# Patient Record
Sex: Female | Born: 1960 | Hispanic: No | Marital: Married | Smoking: Former smoker
Health system: Southern US, Community
[De-identification: ages and names within clinical notes are randomized; demographics above are authoritative.]

## PROBLEM LIST (undated history)

## (undated) DIAGNOSIS — R5383 Other fatigue: Secondary | ICD-10-CM

## (undated) DIAGNOSIS — I6529 Occlusion and stenosis of unspecified carotid artery: Secondary | ICD-10-CM

## (undated) DIAGNOSIS — E042 Nontoxic multinodular goiter: Secondary | ICD-10-CM

## (undated) HISTORY — PX: ABDOMINAL HYSTERECTOMY: SHX81

## (undated) HISTORY — DX: Other fatigue: R53.83

## (undated) HISTORY — DX: Nontoxic multinodular goiter: E04.2

## (undated) HISTORY — DX: Occlusion and stenosis of unspecified carotid artery: I65.29

---

## 2018-05-01 ENCOUNTER — Other Ambulatory Visit: Payer: Self-pay

## 2018-05-01 ENCOUNTER — Encounter: Payer: Self-pay | Admitting: Vascular Surgery

## 2018-05-01 DIAGNOSIS — I6523 Occlusion and stenosis of bilateral carotid arteries: Secondary | ICD-10-CM

## 2018-05-11 ENCOUNTER — Encounter: Payer: Self-pay | Admitting: Vascular Surgery

## 2018-05-11 ENCOUNTER — Ambulatory Visit (INDEPENDENT_AMBULATORY_CARE_PROVIDER_SITE_OTHER): Payer: 59 | Admitting: Vascular Surgery

## 2018-05-11 ENCOUNTER — Ambulatory Visit (HOSPITAL_COMMUNITY)
Admission: RE | Admit: 2018-05-11 | Discharge: 2018-05-11 | Disposition: A | Discharge status: Home / Self Care | Payer: 59 | Source: Ambulatory Visit | Attending: Vascular Surgery | Admitting: Vascular Surgery

## 2018-05-11 VITALS — BP 164/98 | HR 100 | Temp 97.3°F | Resp 18 | Wt 163.0 lb

## 2018-05-11 DIAGNOSIS — I6523 Occlusion and stenosis of bilateral carotid arteries: Secondary | ICD-10-CM | POA: Insufficient documentation

## 2018-05-11 NOTE — Progress Notes (Signed)
Vascular and Vein Specialist of [facility] office  Patient name: Jessica Petersen MRN: [id number] DOB: Dec 07, 1960 Sex: female  REASON FOR CONSULT: Evaluation of carotid stenosis  HPI: Jessica Petersen is a 58 y.o. female, who is here today for evaluation of carotid stenosis.  She was having some soreness in her right neck.  Underwent ultrasound for further evaluation of this and was found to have thyroid nodule and also some thickening of the carotid.  She is undergone a formal carotid duplex at [facility] today and is here today for further discussion of this.  She has never had any hemispheric symptoms.  No transient ischemic attack or stroke.  No aphasia or amaurosis fugax.  He has had a fine-needle aspiration of the thyroid nodule and does not have the results of this back yet.  Past Medical History:  Diagnosis Date  . Carotid artery occlusion   . Fatigue   . Multiple thyroid nodules     Family History  Problem Relation Age of Onset  . Heart disease Father     SOCIAL HISTORY: Social History   Socioeconomic History  . Marital status: Married    Spouse name: Not on file  . Number of children: Not on file  . Years of education: Not on file  . Highest education level: Not on file  Occupational History  . Not on file  Social Needs  . Financial resource strain: Not on file  . Food insecurity:    Worry: Not on file    Inability: Not on file  . Transportation needs:    Medical: Not on file    Non-medical: Not on file  Tobacco Use  . Smoking status: Former Games developer  . Smokeless tobacco: Never Used  Substance and Sexual Activity  . Alcohol use: Not Currently  . Drug use: Never  . Sexual activity: Not on file  Lifestyle  . Physical activity:    Days per week: Not on file    Minutes per session: Not on file  . Stress: Not on file  Relationships  . Social connections:    Talks on phone: Not on file    Gets  together: Not on file    Attends religious service: Not on file    Active member of club or organization: Not on file    Attends meetings of clubs or organizations: Not on file    Relationship status: Not on file  . Intimate partner violence:    Fear of current or ex partner: Not on file    Emotionally abused: Not on file    Physically abused: Not on file    Forced sexual activity: Not on file  Other Topics Concern  . Not on file  Social History Narrative  . Not on file    Allergies  Allergen Reactions  . Naproxen Itching  . Lortab [Hydrocodone-Acetaminophen] Palpitations    No current outpatient medications on file.   No current facility-administered medications for this visit.     REVIEW OF SYSTEMS:  [X]  denotes positive finding, [ ]  denotes negative finding Cardiac  Comments:  Chest pain or chest pressure:    Shortness of breath upon exertion:    Short of breath when lying flat:    Irregular heart rhythm:        Vascular    Pain in calf, thigh, or hip brought on by ambulation:    Pain in feet at night that wakes you up from your sleep:  Blood clot in your veins:    Leg swelling:         Pulmonary    Oxygen at home:    Productive cough:     Wheezing:         Neurologic    Sudden weakness in arms or legs:     Sudden numbness in arms or legs:     Sudden onset of difficulty speaking or slurred speech:    Temporary loss of vision in one eye:     Problems with dizziness:         Gastrointestinal    Blood in stool:     Vomited blood:         Genitourinary    Burning when urinating:     Blood in urine:        Psychiatric    Major depression:         Hematologic    Bleeding problems:    Problems with blood clotting too easily:        Skin    Rashes or ulcers:        Constitutional    Fever or chills:      PHYSICAL EXAM: Vitals:   05/11/18 0951 05/11/18 0957  BP: (!) 158/106 (!) 164/98  Pulse: 90 100  Resp: 18   Temp: (!) 97.3 F (36.3 C)    TempSrc: Temporal   Weight: 163 lb (73.9 kg)     GENERAL: The patient is a well-nourished female, in no acute distress. The vital signs are documented above. CARDIOVASCULAR: Carotid arteries without bruits bilaterally.  2+ radial and 2+ dorsalis pedis pulses PULMONARY: There is good air exchange  ABDOMEN: Soft and non-tender  MUSCULOSKELETAL: There are no major deformities or cyanosis. NEUROLOGIC: No focal weakness or paresthesias are detected. SKIN: There are no ulcers or rashes noted. PSYCHIATRIC: The patient has a normal affect.  DATA:  Carotid duplex from [facility] was reviewed with the patient this showed mild intimal thickening bilaterally but no significant stenosis.  Vertebral flow antegrade bilaterally   3 cm right  thyroid nodule was noted  MEDICAL ISSUES: Discussed these findings with the patient.  She does not have any evidence of significant carotid disease.  I do not feel that she has any increased risk for stroke related to this.  I would not recommend any follow-up duplex unless she had new symptoms.  She was relieved with this discussion and will see [place] again on an as-needed basis   Larina Earthly, MD [facility] Vascular and Vein Specialists of [facility] Tel [phone] Pager [phone]

## 2020-09-14 IMAGING — US BILATERAL CAROTID DUPLEX ULTRASOUND
1 series · 13 of 24 positions shown · non-contrast
Comparison: None.

CLINICAL DATA: Stenosis on office exam

EXAM:
BILATERAL CAROTID DUPLEX ULTRASOUND
TECHNIQUE: Gray scale imaging, color Doppler and duplex ultrasound were
performed of bilateral carotid and vertebral arteries in the neck.

[Series 1: bilateral carotid duplex ultrasound · 13 of 69 slices shown]
[im 1/69]
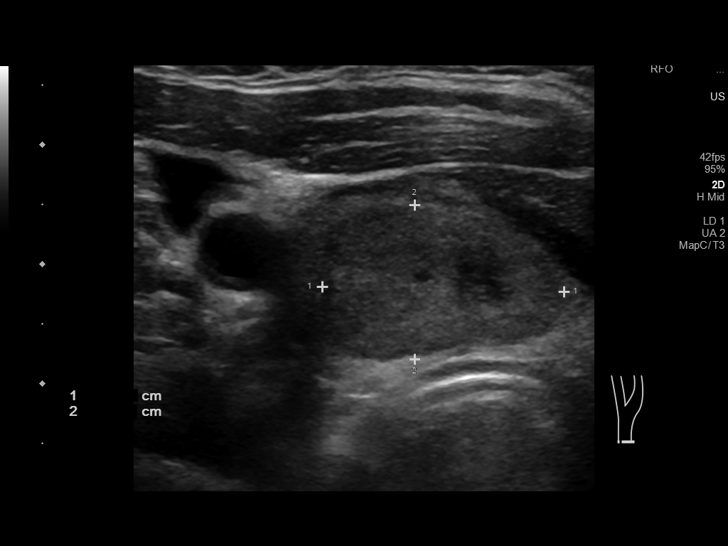
[im 6/69]
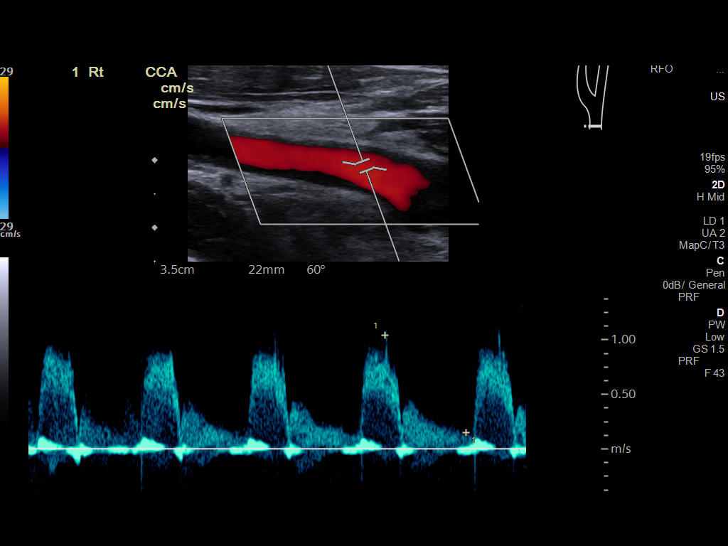
[im 12/69]
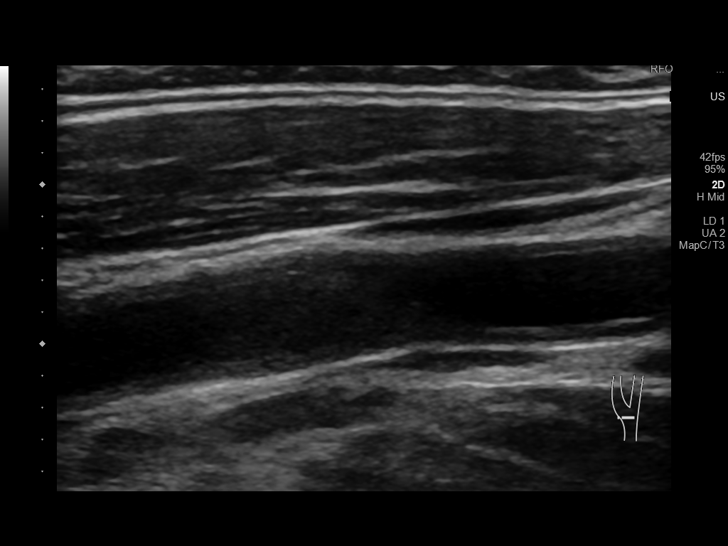
[im 18/69]
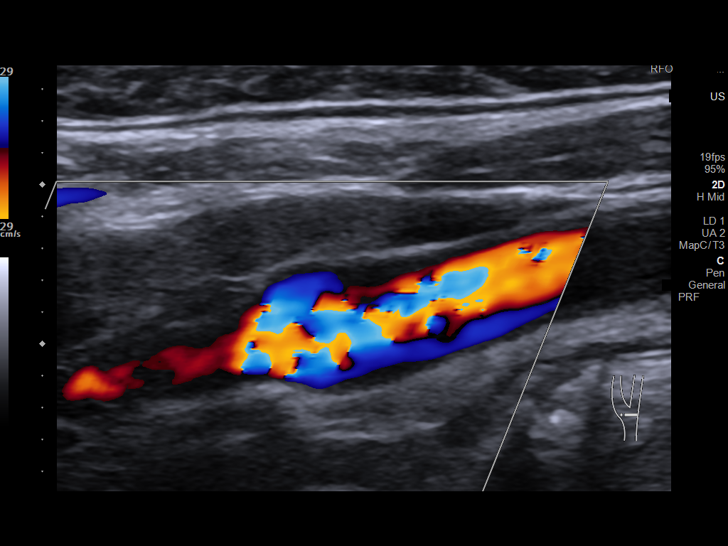
[im 24/69]
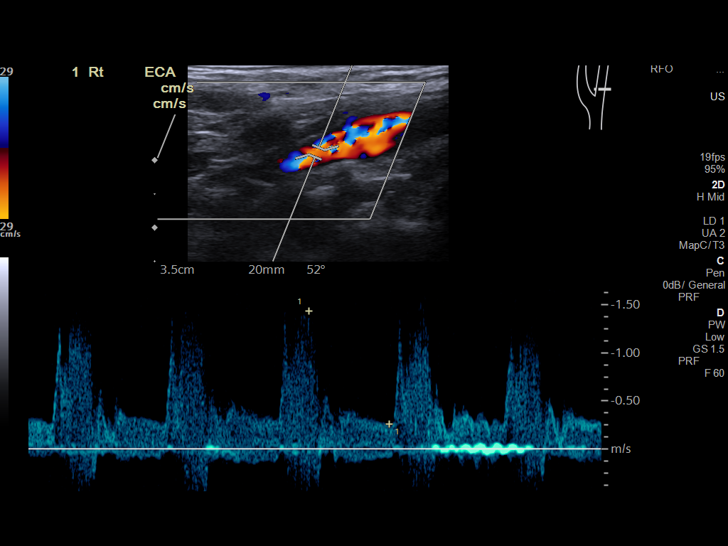
[im 30/69]
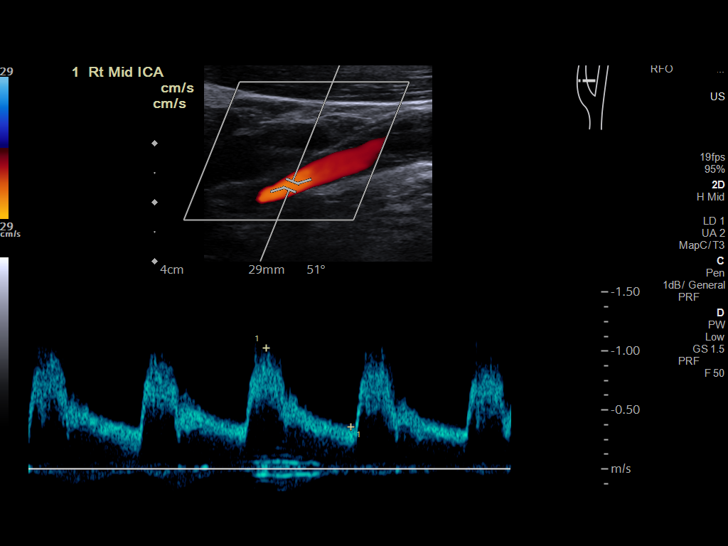
[im 36/69]
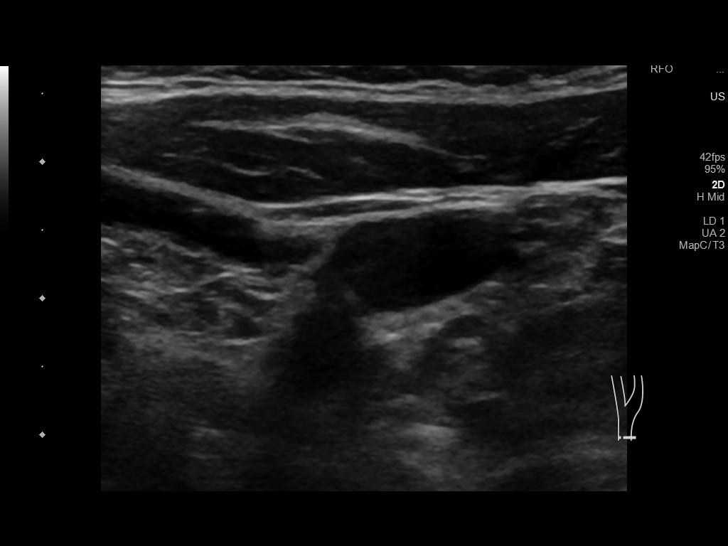
[im 39/69]
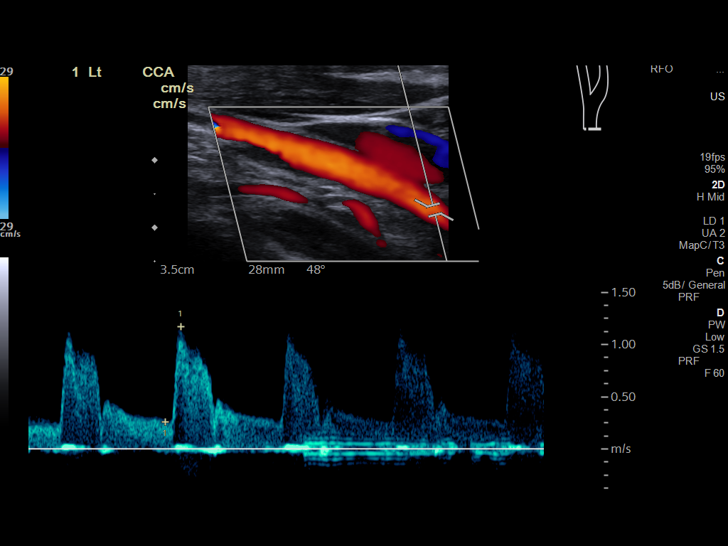
[im 45/69]
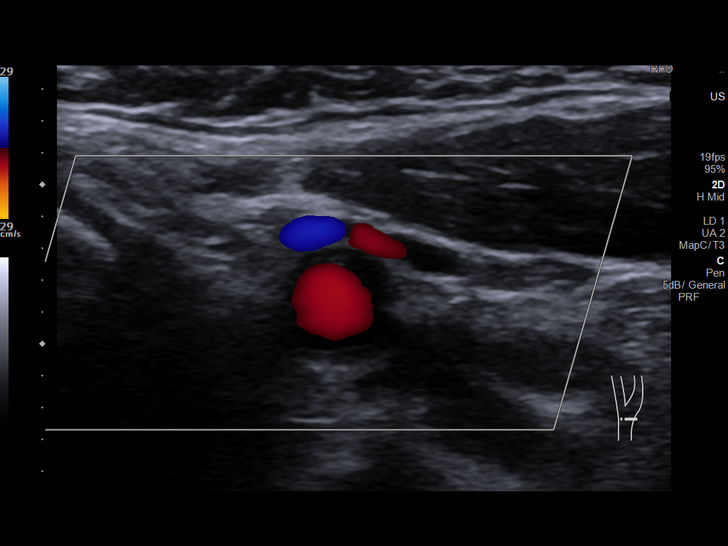
[im 51/69]
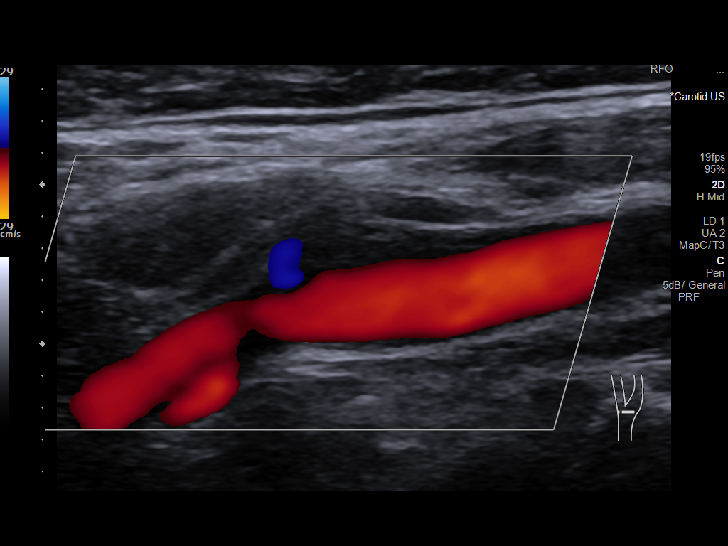
[im 57/69]
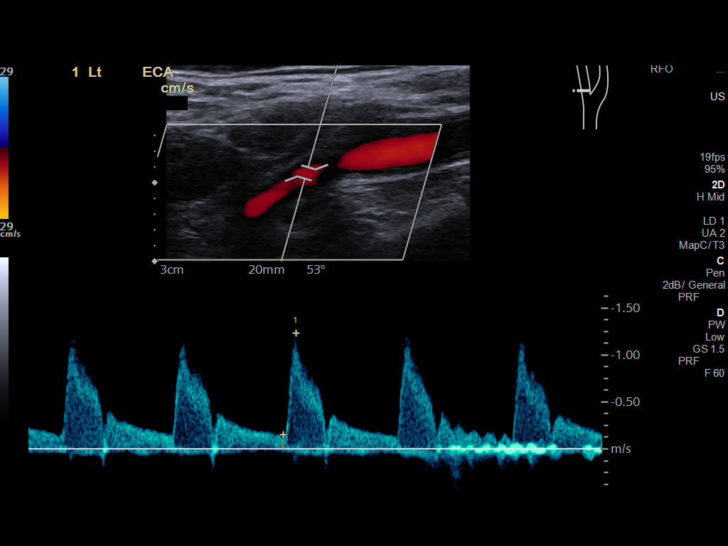
[im 63/69]
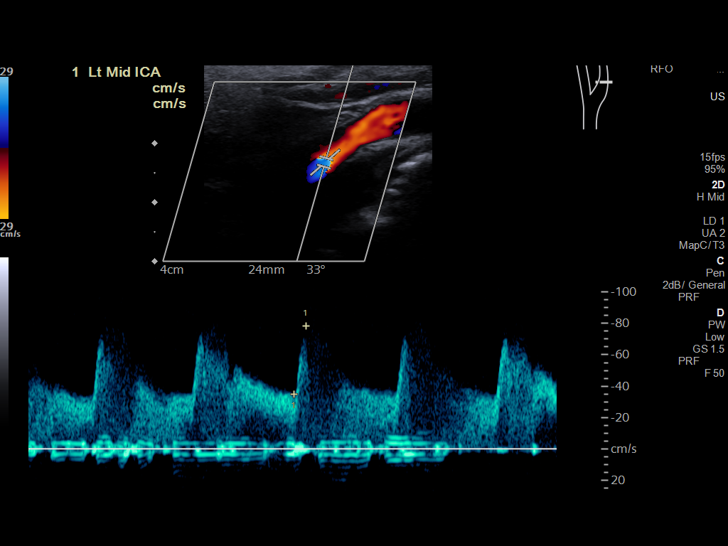
[im 69/69]
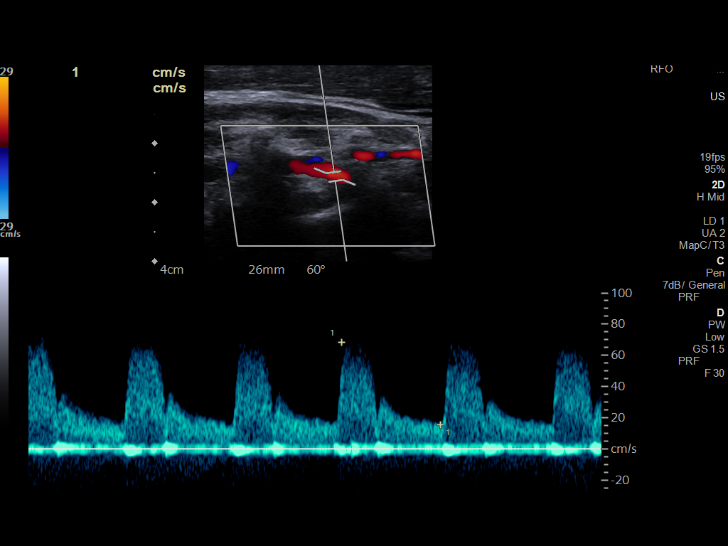

[13 of 24 positions shown; findings below may reference images not displayed]

FINDINGS: Criteria: Quantification of carotid stenosis is based on velocity
parameters that correlate the residual internal carotid diameter
with NASCET-based stenosis levels, using the diameter of the distal
internal carotid lumen as the denominator for stenosis measurement.

The following velocity measurements were obtained:

RIGHT

ICA: 104/41 cm/sec

CCA: 144/40 cm/sec

SYSTOLIC ICA/CCA RATIO:

ECA: 144 cm/sec

LEFT

ICA: 78/35 cm/sec

CCA: 85/23 cm/sec

SYSTOLIC ICA/CCA RATIO:

ECA: 123 cm/sec

RIGHT CAROTID ARTERY: 2.3 cm hypoechoic right thyroid nodule
incidentally noted. Intimal thickening through the common carotid
artery and bulb. No definite high-grade stenosis. Normal waveforms
and color Doppler signal.

RIGHT VERTEBRAL ARTERY:  Normal flow direction and waveform.

LEFT CAROTID ARTERY: Intimal thickening in the common carotid artery
and bulb. No high-grade stenosis. Normal waveforms and color Doppler
signal. Plaque at the external carotid artery origin.

LEFT VERTEBRAL ARTERY:  Normal flow direction and waveform.
IMPRESSION: 1. Bilateral carotid plaque resulting in less than 50% diameter
stenosis.
2.  Antegrade bilateral vertebral arterial flow.
3. 2.3 cm right thyroid nodule. Consider further evaluation with
thyroid ultrasound. If patient is clinically hyperthyroid, consider
nuclear medicine thyroid uptake and scan.

## 2022-12-23 ENCOUNTER — Encounter (INDEPENDENT_AMBULATORY_CARE_PROVIDER_SITE_OTHER): Payer: Self-pay | Admitting: *Deleted

## 2022-12-30 ENCOUNTER — Encounter (INDEPENDENT_AMBULATORY_CARE_PROVIDER_SITE_OTHER): Payer: Self-pay | Admitting: Gastroenterology

## 2022-12-30 ENCOUNTER — Ambulatory Visit (INDEPENDENT_AMBULATORY_CARE_PROVIDER_SITE_OTHER): Payer: 59 | Admitting: Gastroenterology

## 2022-12-30 VITALS — BP 177/81 | HR 78 | Temp 97.5°F | Ht 63.0 in | Wt 162.0 lb

## 2022-12-30 DIAGNOSIS — R7989 Other specified abnormal findings of blood chemistry: Secondary | ICD-10-CM | POA: Diagnosis not present

## 2022-12-30 NOTE — Patient Instructions (Signed)
Perform blood workup If negative testing, will consider hematology referral

## 2022-12-30 NOTE — Progress Notes (Signed)
Katrinka Blazing, M.D. Gastroenterology & Hepatology [facility] [address] [place], [place] [address] Primary Care Physician: Wendall Papa [address] [place] [address]  Referring MD: PCP  Chief Complaint: Elevated ferritin  History of Present Illness: Jessica Petersen is a 62 y.o. female who presents for evaluation of elevated ferritin.  Received labs from referral.  Labs from 12/12/2022 showed a TSH of 0.80, CMP with BUN 6, creatinine 0.8, sodium 141, potassium 4.5, AST 26, ALT 22, alkaline phosphatase 75, total bilirubin 0.3, albumin 4.2, iron 77, ferritin 326. Patient had previous labs in the past that showed elevated ferritin: labs at [facility] in 10/2016 that showed ferritin 272., iron saturation 33%, iron 106. Repeat blood workup in 2019 showed ferritin 306, 290 in 2021.  Patient reports that she was told when younger she had low iron - took ferrous sulfate when she has a teenager. She states she may have taken some iron during the summer months but not consistently.  Patient denies having any complaints and has been asymptomatic.  The patient denies having any nausea, vomiting, fever, chills, hematochezia, melena, hematemesis, abdominal distention, abdominal pain, diarrhea, jaundice, pruritus or weight loss.  Last EGD: never Last Colonoscopy: 2015 - performed at [facility], patient told to repeat in 10 years, no polyps per patient but no reports available  FHx: neg for any gastrointestinal/liver disease, mother pancreatic cancer s/p Whipple surgery Social: quit smoking 30 years ago, neg alcohol or illicit drug use Surgical: hysterectomy, 2 c-sections,   Past Medical History: Past Medical History:  Diagnosis Date   Carotid artery occlusion    Fatigue    Multiple thyroid nodules     Past Surgical History: Past Surgical History:  Procedure Laterality Date   ABDOMINAL HYSTERECTOMY     CESAREAN SECTION       Family History: Family History  Problem Relation Age of Onset   Heart disease Father     Social History: Social History   Tobacco Use  Smoking Status Former  Smokeless Tobacco Never   Social History   Substance and Sexual Activity  Alcohol Use Not Currently   Social History   Substance and Sexual Activity  Drug Use Never    Allergies: Allergies  Allergen Reactions   Naproxen Itching   Lortab [Hydrocodone-Acetaminophen] Palpitations    Medications: No current outpatient medications on file.   No current facility-administered medications for this visit.    Review of Systems: GENERAL: negative for malaise, night sweats HEENT: No changes in hearing or vision, no nose bleeds or other nasal problems. NECK: Negative for lumps, goiter, pain and significant neck swelling RESPIRATORY: Negative for cough, wheezing CARDIOVASCULAR: Negative for chest pain, leg swelling, palpitations, orthopnea GI: SEE HPI MUSCULOSKELETAL: Negative for joint pain or swelling, back pain, and muscle pain. SKIN: Negative for lesions, rash PSYCH: Negative for sleep disturbance, mood disorder and recent psychosocial stressors. HEMATOLOGY Negative for prolonged bleeding, bruising easily, and swollen nodes. ENDOCRINE: Negative for cold or heat intolerance, polyuria, polydipsia and goiter. NEURO: negative for tremor, gait imbalance, syncope and seizures. The remainder of the review of systems is noncontributory.   Physical Exam: BP (!) 177/81 (BP Location: Left Arm, Patient Position: Sitting, Cuff Size: Normal)   Pulse 78   Temp (!) 97.5 F (36.4 C) (Temporal)   Ht 5\' 3"  (1.6 m)   Wt 162 lb (73.5 kg)   BMI 28.70 kg/m  GENERAL: The patient is AO x3, in no acute distress. HEENT: Head is normocephalic  and atraumatic. EOMI are intact. Mouth is well hydrated and without lesions. NECK: Supple. No masses LUNGS: Clear to auscultation. No presence of rhonchi/wheezing/rales. Adequate chest  expansion HEART: RRR, normal s1 and s2. ABDOMEN: Soft, nontender, no guarding, no peritoneal signs, and nondistended. BS +. No masses. EXTREMITIES: Without any cyanosis, clubbing, rash, lesions or edema. NEUROLOGIC: AOx3, no focal motor deficit. SKIN: no jaundice, no rashes   Imaging/Labs: as above  I personally reviewed and interpreted the available labs, imaging and endoscopic files.  Impression and Plan: Jessica Petersen is a 62 y.o. female who presents for evaluation of elevated ferritin.  Patient has presented a chronic history of mildly elevated ferritin.  She has been completely asymptomatic and denies having any complaints.  No family history of hemochromatosis.  In fact, she believes that her mother had a diagnosis of thalassemia.  She has not been taking any exogenous iron supplementation on a regular basis recently.  We discussed that even though her iron studies are showing increased ferritin, the rest of her iron panel has been normal.  Differential for her ferritin elevation is Brode.  Notably, her previous LFTs have been completely normal which makes it less likely that she has hemochromatosis.  We will perform gene testing for now.  I discussed with her that after results of gene testing are back, we will refer her for further evaluation by hematology.  -Check hemochromatosis gene testing -Once blood workup is back will consider hematology referral  All questions were answered.      Katrinka Blazing, MD Gastroenterology and Hepatology [facility]

## 2022-12-31 ENCOUNTER — Telehealth (INDEPENDENT_AMBULATORY_CARE_PROVIDER_SITE_OTHER): Payer: Self-pay | Admitting: *Deleted

## 2022-12-31 NOTE — Telephone Encounter (Signed)
Prog note faxed to pcp

## 2022-12-31 NOTE — Telephone Encounter (Signed)
Mimi from [facility] called requesting office visit note from Dr. Levon Hedger 12/30/22 to be faxed over (I'm sorry she didn't give fax number and I forgot to ask )  Atten mimi

## 2023-01-10 LAB — HEMOCHROMATOSIS DNA-PCR(C282Y,H63D)

## 2023-03-12 NOTE — Progress Notes (Signed)
 [facility] [address] [place] [address]   Clinic Day:  03/13/2023  Referring physician: Hairfield, Keavie C, NP  Patient Care Team: Hairfield, Keavie C as PCP - General (Nurse Practitioner)   ASSESSMENT & PLAN:   Assessment:  1.  Elevated ferritin levels: - Labs by Keavie Hairfield on 12/18/2022: Ferritin 326, serum iron 77. - She was evaluated by Dr. Castaneda for elevated ferritin. - Hemochromatosis panel: Negative. - Took iron tablet intermittently, last took in summer 2024.  No prior history of blood transfusion or intravenous iron therapy.  Does not use Caston pots or pans on regular basis.  Mother has thalassemia.  2. Social/Family History: -She is retired. Quit tobacco use in 1992 of 0.5 ppd for 20 years. -No family history of hemachromatosis. Mother may have thalassemia and has not required any blood transfusions. Mother has pancreatic cancer. Maternal uncle had prostate cancer and pancreatic cancer. Another paternal uncle had prostate cancer.   Plan:  1.  Elevated ferritin levels with negative hemochromatosis panel: - Elevated ferritin previously could be transient from inflammatory state. - She could also have thalassemia trait which could contribute to ineffective erythropoiesis and elevated ferritin levels. - Will repeat ferritin level today along with CBC.  Will also check hemoglobin electrophoresis.  Will check LFTs to evaluate for underlying liver disease. - RTC 2 weeks for follow-up.  If continues to have elevated ferritin levels, will do [facility] hemochromatosis panel which does additional mutation testing.   Orders Placed This Encounter  Procedures   CBC    Standing Status:   Future    Number of Occurrences:   1    Expected Date:   03/13/2023    Expiration Date:   03/12/2024   Reticulocytes    Standing Status:   Future    Number of Occurrences:   1    Expected Date:   03/13/2023    Expiration Date:   03/12/2024   Lactate dehydrogenase     Standing Status:   Future    Number of Occurrences:   1    Expected Date:   03/13/2023    Expiration Date:   03/12/2024   Iron and TIBC ([facility] ONLY)    Standing Status:   Future    Number of Occurrences:   1    Expected Date:   03/13/2023    Expiration Date:   03/12/2024   Ferritin    Standing Status:   Future    Number of Occurrences:   1    Expected Date:   03/13/2023    Expiration Date:   03/12/2024   Hgb Fractionation Cascade    Standing Status:   Future    Number of Occurrences:   1    Expected Date:   03/13/2023    Expiration Date:   03/12/2024   Hepatic function panel    Standing Status:   Future    Number of Occurrences:   1    Expected Date:   03/13/2023    Expiration Date:   03/12/2024      LILLETTE Hummingbird R Teague,acting as a scribe for Alean Stands, MD.,have documented all relevant documentation on the behalf of Alean Stands, MD,as directed by  Alean Stands, MD while in the presence of Alean Stands, MD.   I, Alean Stands MD, have reviewed the above documentation for accuracy and completeness, and I agree with the above.   Alean Stands, MD   1/9/20256:08 PM  CHIEF COMPLAINT/PURPOSE OF  CONSULT:   Diagnosis: Elevated ferritin  Current Therapy: None  HISTORY OF PRESENT ILLNESS:   Jessica Petersen is a 63 y.o. female presenting to clinic today for evaluation of elevated ferritin at the request of Dr. Eartha.  Patient has a family history of hemachromatosis and believes her mother may have been diagnosed with thalassemia. Hemachromatosis DNA-PCR lab from 12/30/22 was negative.   Labs from 12/12/2022 showed a TSH of 0.80, CMP with BUN 6, creatinine 0.8, sodium 141, potassium 4.5, AST 26, ALT 22, alkaline phosphatase 75, total bilirubin 0.3, albumin 4.2, iron 77, ferritin 326.   Today, she states that she is doing well overall. Her appetite level is at 100%. Her energy level is at 50%.  The last time she took oral iron was  in the summer of 2024 for 1 week. She reports she took oral iron due to a history of low iron and anemia. In 2022 or 2023, she had been told to take oral iron by Dr. Leavy. The longest she has taken oral iron consecutively is 1 month. She has no history of receiving IV iron or blood transfusions. She does not cook with any cast iron pots or pans, though she has cornbread 2-3 x a month that is baked in cast iron. She is not on any prescribed medications and she occasionally takes multivitamins, B-12 supplements, and fish oil with the lat time she took these being in the summer of 2024. She denies any BRBPR, melena, fevers, or night sweats.  PAST MEDICAL HISTORY:   Past Medical History: Past Medical History:  Diagnosis Date   Carotid artery occlusion    Fatigue    Multiple thyroid nodules     Surgical History: Past Surgical History:  Procedure Laterality Date   ABDOMINAL HYSTERECTOMY     CESAREAN SECTION      Social History: Social History   Socioeconomic History   Marital status: Married    Spouse name: Not on file   Number of children: Not on file   Years of education: Not on file   Highest education level: Not on file  Occupational History   Not on file  Tobacco Use   Smoking status: Former   Smokeless tobacco: Never  Vaping Use   Vaping status: Never Used  Substance and Sexual Activity   Alcohol use: Not Currently   Drug use: Never   Sexual activity: Not on file  Other Topics Concern   Not on file  Social History Narrative   Not on file   Social Drivers of Health   Financial Resource Strain: Not on file  Food Insecurity: No Food Insecurity (03/13/2023)   Hunger Vital Sign    Worried About Running Out of Food in the Last Year: Never true    Ran Out of Food in the Last Year: Never true  Transportation Needs: No Transportation Needs (03/13/2023)   PRAPARE - Administrator, Civil Service (Medical): No    Lack of Transportation (Non-Medical): No  Physical  Activity: Not on file  Stress: Not on file  Social Connections: Not on file  Intimate Partner Violence: Not At Risk (03/13/2023)   Humiliation, Afraid, Rape, and Kick questionnaire    Fear of Current or Ex-Partner: No    Emotionally Abused: No    Physically Abused: No    Sexually Abused: No    Family History: Family History  Problem Relation Age of Onset   Heart disease Father     Current Medications: No current outpatient  medications on file.   Allergies: Allergies  Allergen Reactions   Naproxen Itching   Lortab [Hydrocodone-Acetaminophen] Palpitations    REVIEW OF SYSTEMS:   Review of Systems  Constitutional:  Negative for chills, fatigue and fever.  HENT:   Negative for lump/mass, mouth sores, nosebleeds, sore throat and trouble swallowing.   Eyes:  Negative for eye problems.  Respiratory:  Negative for cough and shortness of breath.   Cardiovascular:  Negative for chest pain, leg swelling and palpitations.  Gastrointestinal:  Negative for abdominal pain, constipation, diarrhea, nausea and vomiting.  Genitourinary:  Negative for bladder incontinence, difficulty urinating, dysuria, frequency, hematuria and nocturia.   Musculoskeletal:  Negative for arthralgias, back pain, flank pain, myalgias and neck pain.  Skin:  Negative for itching and rash.  Neurological:  Negative for dizziness, headaches and numbness.  Hematological:  Does not bruise/bleed easily.  Psychiatric/Behavioral:  Negative for depression, sleep disturbance and suicidal ideas. The patient is not nervous/anxious.   All other systems reviewed and are negative.    VITALS:   Blood pressure (!) 171/82, pulse 74, temperature 99.2 F (37.3 C), temperature source Oral, resp. rate 18, height 5' 3 (1.6 m), weight 157 lb (71.2 kg), SpO2 99%.  Wt Readings from Last 3 Encounters:  03/13/23 157 lb (71.2 kg)  12/30/22 162 lb (73.5 kg)  05/11/18 163 lb (73.9 kg)    Body mass index is 27.81 kg/m.   PHYSICAL  EXAM:   Physical Exam Vitals and nursing note reviewed. Exam conducted with a chaperone present.  Constitutional:      Appearance: Normal appearance.  Cardiovascular:     Rate and Rhythm: Normal rate and regular rhythm.     Pulses: Normal pulses.     Heart sounds: Normal heart sounds.  Pulmonary:     Effort: Pulmonary effort is normal.     Breath sounds: Normal breath sounds.  Abdominal:     Palpations: Abdomen is soft. There is no hepatomegaly, splenomegaly or mass.     Tenderness: There is no abdominal tenderness.  Musculoskeletal:     Right lower leg: No edema.     Left lower leg: No edema.  Lymphadenopathy:     Cervical: No cervical adenopathy.     Right cervical: No superficial, deep or posterior cervical adenopathy.    Left cervical: No superficial, deep or posterior cervical adenopathy.     Upper Body:     Right upper body: No supraclavicular or axillary adenopathy.     Left upper body: No supraclavicular or axillary adenopathy.  Neurological:     General: No focal deficit present.     Mental Status: She is alert and oriented to person, place, and time.  Psychiatric:        Mood and Affect: Mood normal.        Behavior: Behavior normal.     LABS:   CBC    Component Value Date/Time   WBC 6.3 03/13/2023 1051   RBC 5.87 (H) 03/13/2023 1051   RBC 5.70 (H) 03/13/2023 1051   HGB 14.0 03/13/2023 1051   HCT 43.0 03/13/2023 1051   PLT 296 03/13/2023 1051   MCV 75.4 (L) 03/13/2023 1051   MCH 24.6 (L) 03/13/2023 1051   MCHC 32.6 03/13/2023 1051   RDW 14.5 03/13/2023 1051    CMP    Component Value Date/Time   PROT 7.8 03/13/2023 1051   ALBUMIN 4.1 03/13/2023 1051   AST 22 03/13/2023 1051   ALT 22 03/13/2023 1051  ALKPHOS 65 03/13/2023 1051   BILITOT 0.6 03/13/2023 1051    No results found for: CEA1, CEA / No results found for: CEA1, CEA No results found for: PSA1 No results found for: CAN199 No results found for: CAN125  No results found  for: STEPHANY RINGS, A1GS, A2GS, BETS, BETA2SER, GAMS, MSPIKE, SPEI Lab Results  Component Value Date   TIBC 395 03/13/2023   FERRITIN 175 03/13/2023   IRONPCTSAT 25 03/13/2023   Lab Results  Component Value Date   LDH 152 03/13/2023     STUDIES:   No results found.

## 2023-03-13 ENCOUNTER — Inpatient Hospital Stay: Payer: 59

## 2023-03-13 ENCOUNTER — Inpatient Hospital Stay: Payer: 59 | Attending: Hematology | Admitting: Hematology

## 2023-03-13 ENCOUNTER — Encounter: Payer: Self-pay | Admitting: Hematology

## 2023-03-13 VITALS — BP 171/82 | HR 74 | Temp 99.2°F | Resp 18 | Ht 63.0 in | Wt 157.0 lb

## 2023-03-13 DIAGNOSIS — D563 Thalassemia minor: Secondary | ICD-10-CM | POA: Diagnosis not present

## 2023-03-13 DIAGNOSIS — Z87891 Personal history of nicotine dependence: Secondary | ICD-10-CM | POA: Insufficient documentation

## 2023-03-13 DIAGNOSIS — R7989 Other specified abnormal findings of blood chemistry: Secondary | ICD-10-CM | POA: Diagnosis not present

## 2023-03-13 LAB — CBC
HCT: 43 % (ref 36.0–46.0)
Hemoglobin: 14 g/dL (ref 12.0–15.0)
MCH: 24.6 pg — ABNORMAL LOW (ref 26.0–34.0)
MCHC: 32.6 g/dL (ref 30.0–36.0)
MCV: 75.4 fL — ABNORMAL LOW (ref 80.0–100.0)
Platelets: 296 10*3/uL (ref 150–400)
RBC: 5.7 MIL/uL — ABNORMAL HIGH (ref 3.87–5.11)
RDW: 14.5 % (ref 11.5–15.5)
WBC: 6.3 10*3/uL (ref 4.0–10.5)
nRBC: 0 % (ref 0.0–0.2)

## 2023-03-13 LAB — IRON AND TIBC
Iron: 100 ug/dL (ref 28–170)
Saturation Ratios: 25 % (ref 10.4–31.8)
TIBC: 395 ug/dL (ref 250–450)
UIBC: 295 ug/dL

## 2023-03-13 LAB — RETICULOCYTES
Immature Retic Fract: 8 % (ref 2.3–15.9)
RBC.: 5.87 MIL/uL — ABNORMAL HIGH (ref 3.87–5.11)
Retic Count, Absolute: 59.3 10*3/uL (ref 19.0–186.0)
Retic Ct Pct: 1 % (ref 0.4–3.1)

## 2023-03-13 LAB — FERRITIN: Ferritin: 175 ng/mL (ref 11–307)

## 2023-03-13 LAB — HEPATIC FUNCTION PANEL
ALT: 22 U/L (ref 0–44)
AST: 22 U/L (ref 15–41)
Albumin: 4.1 g/dL (ref 3.5–5.0)
Alkaline Phosphatase: 65 U/L (ref 38–126)
Bilirubin, Direct: 0.1 mg/dL (ref 0.0–0.2)
Indirect Bilirubin: 0.5 mg/dL (ref 0.3–0.9)
Total Bilirubin: 0.6 mg/dL (ref 0.0–1.2)
Total Protein: 7.8 g/dL (ref 6.5–8.1)

## 2023-03-13 LAB — LACTATE DEHYDROGENASE: LDH: 152 U/L (ref 98–192)

## 2023-03-13 NOTE — Patient Instructions (Addendum)
 You were seen and examined today by Dr. Rogers. Dr. Rogers is a hematologist, meaning that he specializes in blood abnormalities. Dr. Rogers discussed your past medical history, family history of cancers/blood conditions and the events that led to you being here today.  You were referred to Dr. Rogers due to having an elevated ferritin level..  Dr. Katragadda has recommended additional labs today for further evaluation.  Follow-up as scheduled.

## 2023-03-14 ENCOUNTER — Inpatient Hospital Stay: Payer: 59

## 2023-03-14 ENCOUNTER — Inpatient Hospital Stay: Payer: 59 | Admitting: Oncology

## 2023-03-18 LAB — HGB FRACTIONATION CASCADE
Hgb A2: 6.1 % — ABNORMAL HIGH (ref 1.8–3.2)
Hgb A: 93.3 % — ABNORMAL LOW (ref 96.4–98.8)
Hgb F: 0.6 % (ref 0.0–2.0)
Hgb S: 0 %

## 2023-03-27 ENCOUNTER — Inpatient Hospital Stay (HOSPITAL_BASED_OUTPATIENT_CLINIC_OR_DEPARTMENT_OTHER): Payer: 59 | Admitting: Hematology

## 2023-03-27 ENCOUNTER — Encounter: Payer: Self-pay | Admitting: Hematology

## 2023-03-27 VITALS — BP 179/93 | HR 82 | Temp 97.8°F | Resp 18 | Wt 156.0 lb

## 2023-03-27 DIAGNOSIS — R7989 Other specified abnormal findings of blood chemistry: Secondary | ICD-10-CM | POA: Diagnosis not present

## 2023-03-27 DIAGNOSIS — D563 Thalassemia minor: Secondary | ICD-10-CM | POA: Diagnosis not present

## 2023-03-27 NOTE — Patient Instructions (Signed)
[facility] at [facility] Discharge Instructions   You were seen and examined today by Dr. Ellin Saba.  He reviewed the results of your lab work we did at your previous visit. Your ferritin level was normal. 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   You may follow up with your regular doctor.    Thank you for choosing [facility] at [facility] to provide your oncology and hematology care.  To afford each patient quality time with our provider, please arrive at least 15 minutes before your scheduled appointment time.   If you have a lab appointment with the Cancer Center please come in thru the Main Entrance and check in at the main information desk.  You need to re-schedule your appointment should you arrive 10 or more minutes late.  We strive to give you quality time with our providers, and arriving late affects you and other patients whose appointments are after yours.  Also, if you no show three or more times for appointments you may be dismissed from the clinic at the providers discretion.     Again, thank you for choosing [facility].  Our hope is that these requests will decrease the amount of time that you wait before being seen by our physicians.       _____________________________________________________________  Should you have questions after your visit to [facility], please contact our office at [phone] and follow the prompts.  Our office hours are 8:00 a.m. and 4:30 p.m. Monday - Friday.  Please note that voicemails left after 4:00 p.m. may not be returned until the following business day.  We are closed weekends and major holidays.  You do have access to a nurse 24-7, just call the main number to the clinic [phone] and do not press any options, hold on the line and a nurse will answer  the phone.    For prescription refill requests, have your pharmacy contact our office and allow 72 hours.    Due to Covid, you will need to wear a mask upon entering the hospital. If you do not have a mask, a mask will be given to you at the Main Entrance upon arrival. For doctor visits, patients may have 1 support person age 45 or older with them. For treatment visits, patients can not have anyone with them due to social distancing guidelines and our immunocompromised population.

## 2023-03-27 NOTE — Progress Notes (Signed)
[facility] [place] [address], [place] [address]   Clinic Day:  04/03/2023  Referring physician: Wendall Papa, NP  Patient Care Team: Wendall Papa as PCP - General (Nurse Practitioner)   ASSESSMENT & PLAN:   Assessment:  1.  Elevated ferritin levels: - Labs by Jessica Petersen on 12/18/2022: Ferritin 326, serum iron 77. - She was evaluated by Dr. Levon Hedger for elevated ferritin. - Hemochromatosis panel: Negative. - Took iron tablet intermittently, last took in summer 2024.  No prior history of blood transfusion or intravenous iron therapy.  Does not use Caston pots or pans on regular basis.  Mother has thalassemia.  2. Social/Family History: -She is retired. Quit tobacco use in 1992 of 0.5 ppd for 20 years. -No family history of hemachromatosis. Mother may have thalassemia and has not required any blood transfusions. Mother has pancreatic cancer. Maternal uncle had prostate cancer and pancreatic cancer. Another paternal uncle had prostate cancer.   Plan:  1.  Elevated ferritin levels with negative hemochromatosis panel: - Elevated ferritin has resolved.  Current ferritin levels are 175 with percent saturation of 25.  This could be postinflammatory.  No indication for further workup.  She may follow-up with her PMD.  I will be glad to see her on an as-needed basis.  2.  Microcytosis: - She has microcytosis with MCV 75, normal hemoglobin and elevated RBC count suggestive of thalassemia trait. - Hemoglobin electrophoresis shows hemoglobin a 93.3, hemoglobin 8 to 6.1% indicative of beta thalassemia minor.   No orders of the defined types were placed in this encounter.     Alben Deeds Teague,acting as a Neurosurgeon for Doreatha Massed, MD.,have documented all relevant documentation on the behalf of Doreatha Massed, MD,as directed by  Doreatha Massed, MD while in the presence of Doreatha Massed, MD.  I, Doreatha Massed MD, have  reviewed the above documentation for accuracy and completeness, and I agree with the above.    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 female presenting to clinic today for evaluation of elevated ferritin at the request of Dr. Levon Hedger.  Patient has a family history of hemachromatosis and believes her mother may have been diagnosed with thalassemia. Hemachromatosis DNA-PCR lab from 12/30/22 was negative.   Labs from 12/12/2022 showed a TSH of 0.80, CMP with BUN 6, creatinine 0.8, sodium 141, potassium 4.5, AST 26, ALT 22, alkaline phosphatase 75, total bilirubin 0.3, albumin 4.2, iron 77, ferritin 326.   Today, she states that she is doing well overall. Her appetite level is at 100%. Her energy level is at 50%.  The last time she took oral iron was in the summer of 2024 for 1 week. She reports she took oral iron due to a history of low iron and anemia. In 2022 or 2023, she had been told to take oral iron by Dr. Lorette Ang. The longest she has taken oral iron consecutively is 1 month. She has no history of receiving IV iron or blood transfusions. She does not cook with any cast iron pots or pans, though she has cornbread 2-3 x a month that is baked in cast iron. She is not on any prescribed medications and she occasionally takes multivitamins, B-12 supplements, and fish oil with the lat time she took these being in the summer of 2024.  She denies any BRBPR, melena, fevers, or night sweats.  PAST MEDICAL HISTORY:   Jessica Petersen is a 63 y.o. female presenting to the clinic today for follow-up of elevated ferritin. She was last seen by me on 03/13/23.  Today, she states that she is doing well overall. Her appetite level is at 100%. Her energy level is at 75%. She follows up with her PCP and has an appointment  with her next week.   PAST MEDICAL HISTORY:   Past Medical History: Past Medical History:  Diagnosis Date   Carotid artery occlusion    Fatigue    Multiple thyroid nodules     Surgical History: Past Surgical History:  Procedure Laterality Date   ABDOMINAL HYSTERECTOMY     CESAREAN SECTION      Social History: Social History   Socioeconomic History   Marital status: Married    Spouse name: Not on file   Number of children: Not on file   Years of education: Not on file   Highest education level: Not on file  Occupational History   Not on file  Tobacco Use   Smoking status: Former   Smokeless tobacco: Never  Vaping Use   Vaping status: Never Used  Substance and Sexual Activity   Alcohol use: Not Currently   Drug use: Never   Sexual activity: Not on file  Other Topics Concern   Not on file  Social History Narrative   Not on file   Social Drivers of Health   Financial Resource Strain: Not on file  Food Insecurity: No Food Insecurity (03/13/2023)   Hunger Vital Sign    Worried About Running Out of Food in the Last Year: Never true    Ran Out of Food in the Last Year: Never true  Transportation Needs: No Transportation Needs (03/13/2023)   PRAPARE - Administrator, Civil Service (Medical): No    Lack of Transportation (Non-Medical): No  Physical Activity: Not on file  Stress: Not on file  Social Connections: Not on file  Intimate Partner Violence: Not At Risk (03/13/2023)   Humiliation, Afraid, Rape, and Kick questionnaire    Fear of Current or Ex-Partner: No    Emotionally Abused: No    Physically Abused: No    Sexually Abused: No    Family History: Family History  Problem Relation Age of Onset   Heart disease Father     Current Medications: No current outpatient medications on file.   Allergies: Allergies  Allergen Reactions   Naproxen Itching   Lortab [Hydrocodone-Acetaminophen] Palpitations    REVIEW OF SYSTEMS:   Review of Systems   Constitutional:  Negative for chills, fatigue and fever.  HENT:   Negative for lump/mass, mouth sores, nosebleeds, sore throat and trouble swallowing.   Eyes:  Negative for eye problems.  Respiratory:  Negative for cough and shortness of breath.   Cardiovascular:  Negative for chest pain, leg swelling and palpitations.  Gastrointestinal:  Negative for abdominal pain, constipation, diarrhea, nausea and vomiting.  Genitourinary:  Negative for bladder incontinence, difficulty urinating, dysuria, frequency, hematuria and nocturia.   Musculoskeletal:  Negative for arthralgias, back pain, flank pain, myalgias and neck pain.  Skin:  Negative for itching and rash.  Neurological:  Negative for dizziness, headaches and numbness.  Hematological:  Does not bruise/bleed easily.  Psychiatric/Behavioral:  Negative for depression, sleep disturbance and suicidal ideas. The patient is not nervous/anxious.   All other systems reviewed and are negative.    VITALS:  Blood pressure (!) 179/93, pulse 82, temperature 97.8 F (36.6 C), temperature source Tympanic, resp. rate 18, weight 156 lb (70.8 kg), SpO2 99%.  Wt Readings from Last 3 Encounters:  03/27/23 156 lb (70.8 kg)  03/13/23 157 lb (71.2 kg)  12/30/22 162 lb (73.5 kg)    Body mass index is 27.63 kg/m.   PHYSICAL EXAM:   Physical Exam Vitals and nursing note reviewed. Exam conducted with a chaperone present.  Constitutional:      Appearance: Normal appearance.  Cardiovascular:     Rate and Rhythm: Normal rate and regular rhythm.     Pulses: Normal pulses.     Heart sounds: Normal heart sounds.  Pulmonary:     Effort: Pulmonary effort is normal.     Breath sounds: Normal breath sounds.  Abdominal:     Palpations: Abdomen is soft. There is no hepatomegaly, splenomegaly or mass.     Tenderness: There is no abdominal tenderness.  Musculoskeletal:     Right lower leg: No edema.     Left lower leg: No edema.  Lymphadenopathy:      Cervical: No cervical adenopathy.     Right cervical: No superficial, deep or posterior cervical adenopathy.    Left cervical: No superficial, deep or posterior cervical adenopathy.     Upper Body:     Right upper body: No supraclavicular or axillary adenopathy.     Left upper body: No supraclavicular or axillary adenopathy.  Neurological:     General: No focal deficit present.     Mental Status: She is alert and oriented to person, place, and time.  Psychiatric:        Mood and Affect: Mood normal.        Behavior: Behavior normal.     LABS:   CBC    Component Value Date/Time   WBC 6.3 03/13/2023 1051   RBC 5.87 (H) 03/13/2023 1051   RBC 5.70 (H) 03/13/2023 1051   HGB 14.0 03/13/2023 1051   HCT 43.0 03/13/2023 1051   PLT 296 03/13/2023 1051   MCV 75.4 (L) 03/13/2023 1051   MCH 24.6 (L) 03/13/2023 1051   MCHC 32.6 03/13/2023 1051   RDW 14.5 03/13/2023 1051    CMP    Component Value Date/Time   PROT 7.8 03/13/2023 1051   ALBUMIN 4.1 03/13/2023 1051   AST 22 03/13/2023 1051   ALT 22 03/13/2023 1051   ALKPHOS 65 03/13/2023 1051   BILITOT 0.6 03/13/2023 1051    No results found for: "CEA1", "CEA" / No results found for: "CEA1", "CEA" No results found for: "PSA1" No results found for: "GNF621" No results found for: "CAN125"  No results found for: "TOTALPROTELP", "ALBUMINELP", "A1GS", "A2GS", "BETS", "BETA2SER", "GAMS", "MSPIKE", "SPEI" Lab Results  Component Value Date   TIBC 395 03/13/2023   FERRITIN 175 03/13/2023   IRONPCTSAT 25 03/13/2023   Lab Results  Component Value Date   LDH 152 03/13/2023     STUDIES:   No results found.

## 2023-04-03 DIAGNOSIS — D563 Thalassemia minor: Secondary | ICD-10-CM | POA: Insufficient documentation

## 2024-01-07 ENCOUNTER — Encounter (INDEPENDENT_AMBULATORY_CARE_PROVIDER_SITE_OTHER): Payer: Self-pay | Admitting: Gastroenterology
